# Patient Record
Sex: Female | Born: 1995 | Race: White | Hispanic: No | Marital: Single | Smoking: Never smoker
Health system: Southern US, Community
[De-identification: ages and names within clinical notes are randomized; demographics above are authoritative.]

---
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
## 2017-12-31 IMAGING — MR MR HEAD WO/W CM
13 of 18 series · 33 of 48 positions shown · IV contrast (multihance)
Comparison: None.

CLINICAL DATA: Eye swelling noted on routine eye examination today.
Evaluate optic disc edema.

EXAM:
MRI HEAD AND ORBITS WITHOUT AND WITH CONTRAST
TECHNIQUE: Multiplanar, multiecho pulse sequences of the brain and surrounding
structures were obtained without and with intravenous contrast.
Multiplanar, multiecho pulse sequences of the orbits and surrounding
structures were obtained including fat saturation techniques, before
and after intravenous contrast administration.
CONTRAST:  15mL MULTIHANCE GADOBENATE DIMEGLUMINE 529 MG/ML IV SOLN

[Series 2: T1 · sagittal · 5.0mm · 0.45mm/px · 3 of 25 slices shown (1 of 3)]
[im 1/25]
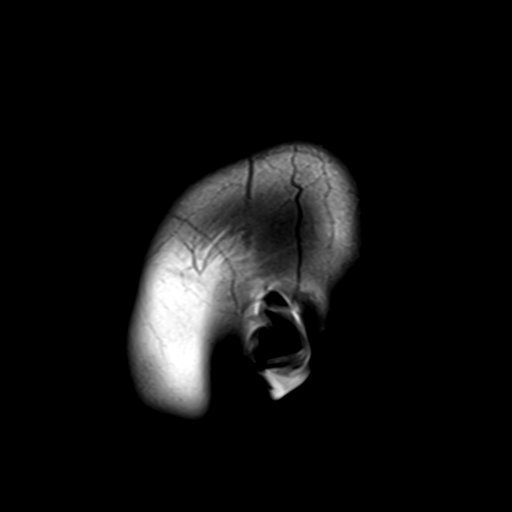
[im 13/25]
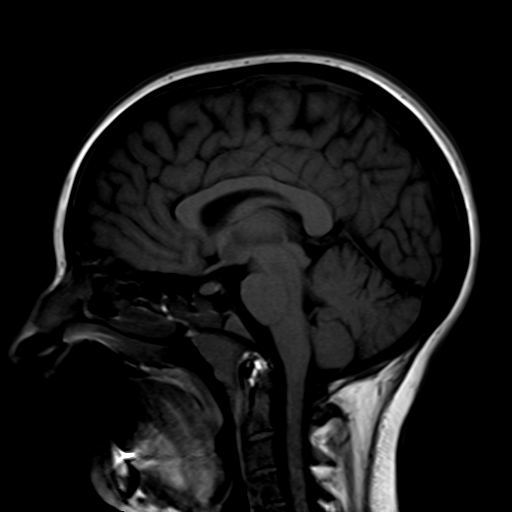
[im 25/25]
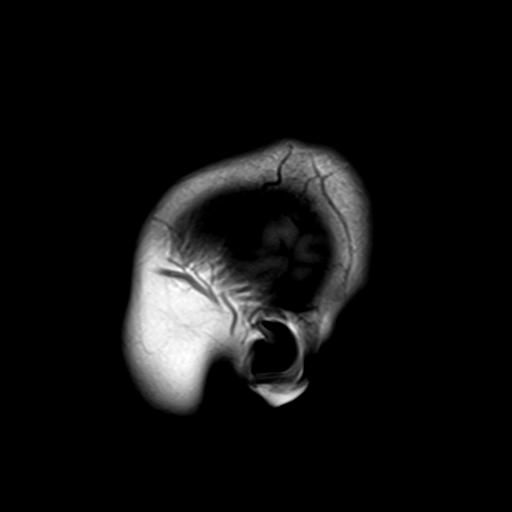

[Series 4: DWI · axial · 3.0mm · 1.80mm/px · z∈[-38,+124]mm · 5 of 55 slices shown (1 of 2)]
[im 1/55]
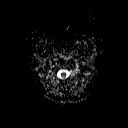
[im 14/55]
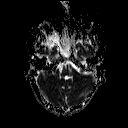
[im 28/55]
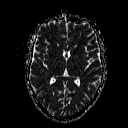
[im 41/55]
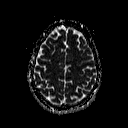
[im 55/55]
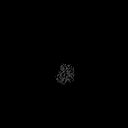

[Series 6: DWI · coronal · 3.0mm · 1.80mm/px · 5 of 45 slices shown (2 of 2)]
[im 1/45]
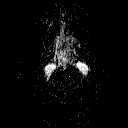
[im 12/45]
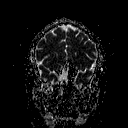
[im 23/45]
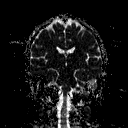
[im 34/45]
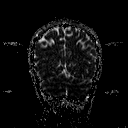
[im 45/45]
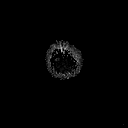

[Series 7: T2 · axial · 5.0mm · 0.60mm/px · z∈[-35,+121]mm · 2 of 25 slices shown (1 of 2)]
[im 1/25]
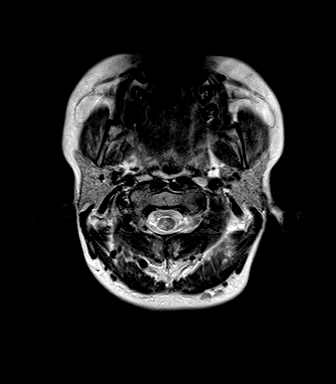
[im 25/25]
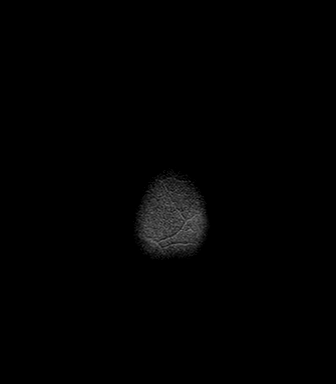

[Series 8: FLAIR · axial · 5.0mm · 0.45mm/px · z∈[-35,+121]mm · 2 of 25 slices shown]
[im 1/25]
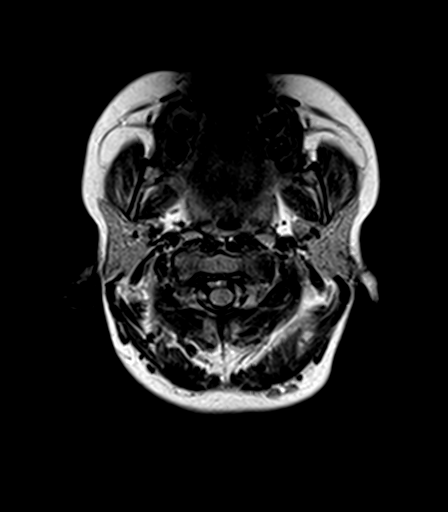
[im 25/25]
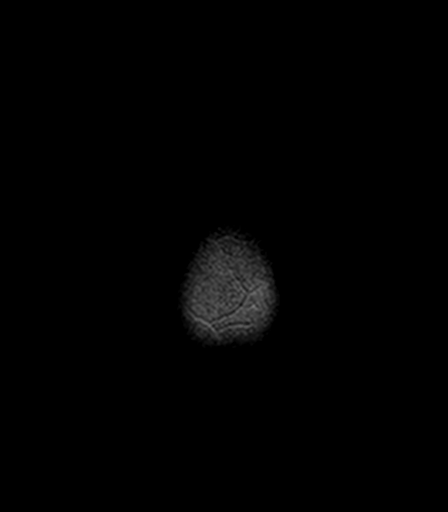

[Series 9: T2 · axial · 5.0mm · 0.45mm/px · z∈[-35,+121]mm · 2 of 25 slices shown (2 of 2)]
[im 1/25]
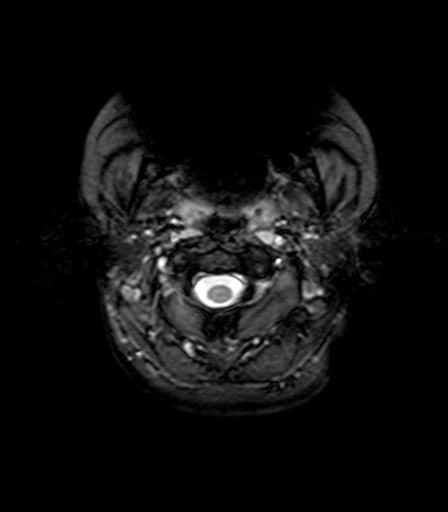
[im 25/25]
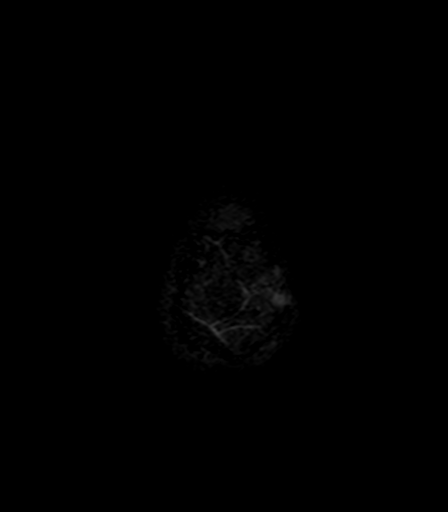

[Series 10: T2 fat-sat · axial · 3.0mm · 0.35mm/px · 1 of 17 slices shown (1 of 2)]
[im 1/17]
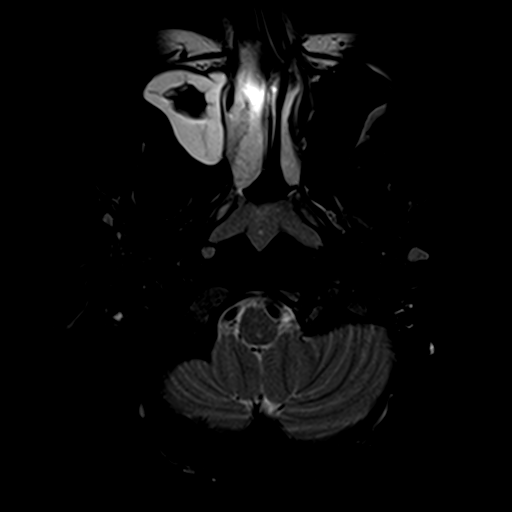

[Series 11: T2 fat-sat · coronal · 3.0mm · 0.35mm/px · 2 of 27 slices shown (2 of 2)]
[im 1/27]
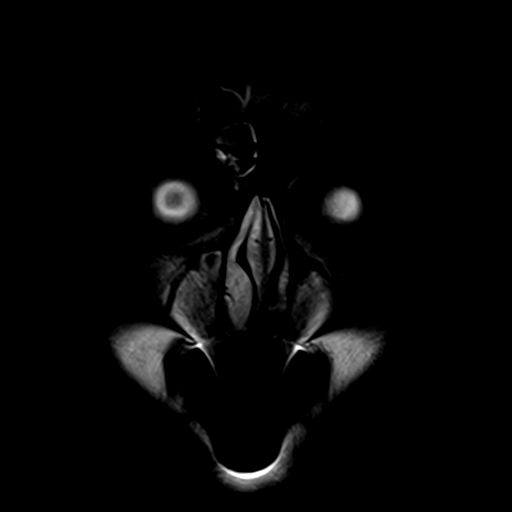
[im 27/27]
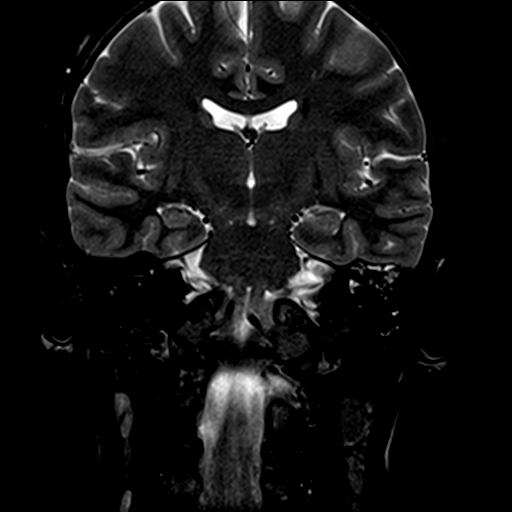

[Series 12: T1 · axial · 3.0mm · 0.39mm/px · 1 of 17 slices shown (2 of 3)]
[im 1/17]
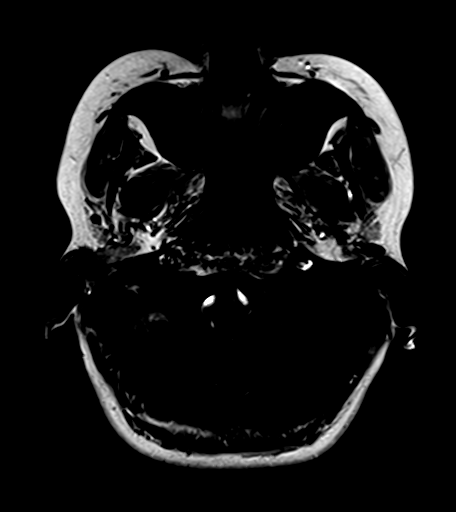

[Series 13: T1 · coronal · 3.0mm · 0.39mm/px · 2 of 27 slices shown (3 of 3)]
[im 1/27]
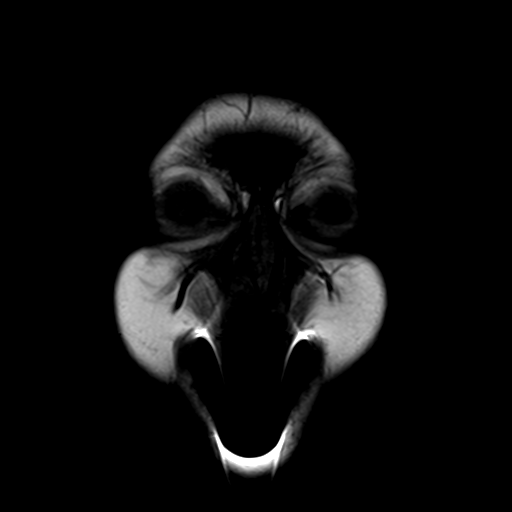
[im 27/27]
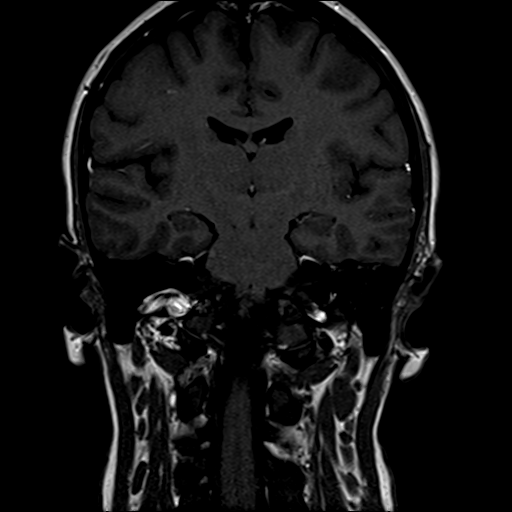

[Series 15: T2 post-contrast · coronal · 5.0mm · 0.49mm/px · 2 of 27 slices shown]
[im 1/27]
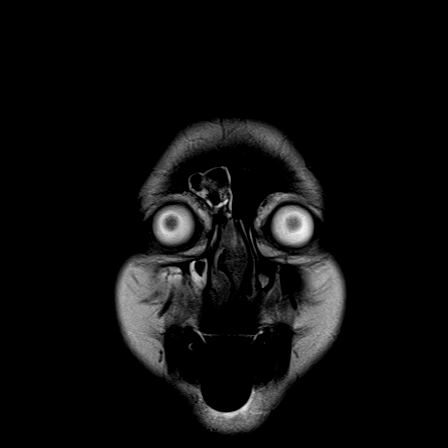
[im 27/27]
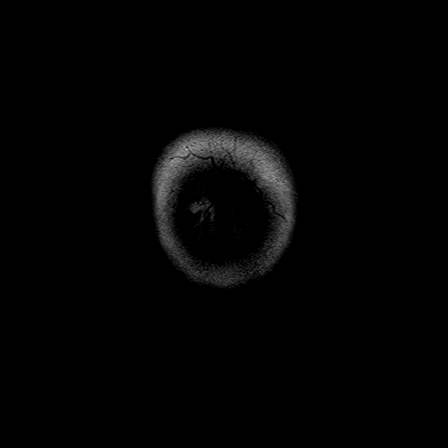

[Series 18: T1 post-contrast · axial · 3.0mm · 1.00mm/px · z∈[-38,+126]mm · 4 of 56 slices shown (1 of 2)]
[im 1/56]
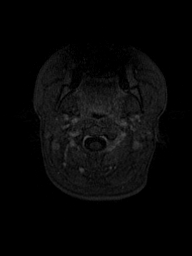
[im 19/56]
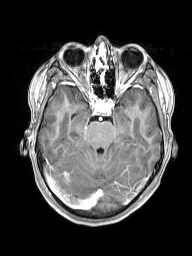
[im 37/56]
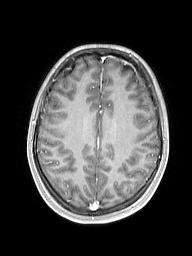
[im 56/56]
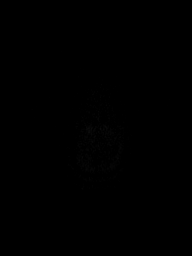

[Series 19: T1 post-contrast · coronal · 5.0mm · 0.43mm/px · 2 of 27 slices shown (2 of 2)]
[im 1/27]
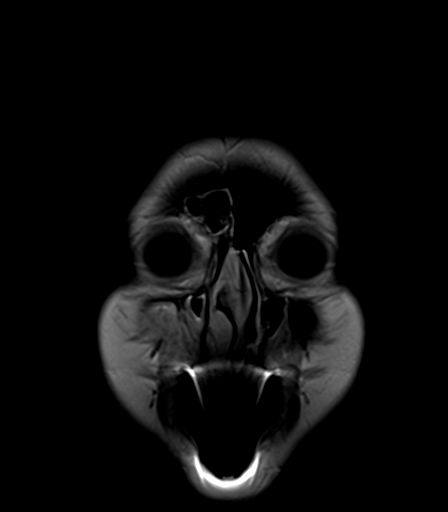
[im 27/27]
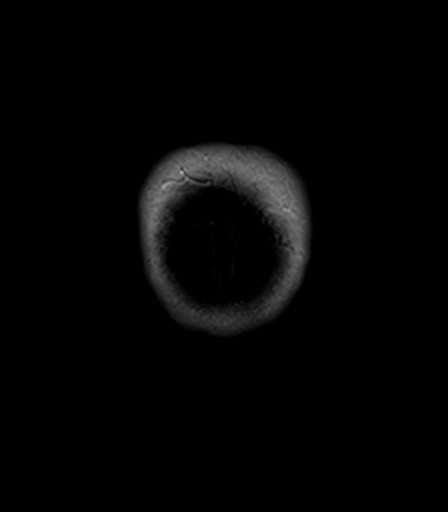

[33 of 48 positions shown; findings below may reference images not displayed]

FINDINGS: MRI HEAD FINDINGS

INTRACRANIAL CONTENTS: No reduced diffusion to suggest acute
ischemia or hyperacute demyelination. No susceptibility artifact to
suggest hemorrhage. The ventricles and sulci are normal for
patient's age. 6 mm ovoid T2 hyperintensity periphery of RIGHT
mesial cerebellum. No masses, mass effect. No abnormal
intraparenchymal or extra-axial enhancement. No abnormal extra-axial
fluid collections. No extra-axial masses. Dural venous sinuses are
patent with normal enhancement.

VASCULAR: Normal major intracranial vascular flow voids present at
skull base.

SKULL AND UPPER CERVICAL SPINE: No abnormal sellar expansion ;
normal appearance of the pituitary gland. No suspicious calvarial
bone marrow signal. Craniocervical junction maintained. Small
bilateral lateral pharyngeal lymph nodes.

OTHER: None.

MRI ORBITS FINDINGS

ORBITS: Ocular globes are intact with normal signal. Slight
symmetric flattening of the optic discs. Lenses are located.
Preservation of the orbital fat. Enlarged bilateral optic nerve
sheaths and cerebral spinal fluid space with tortuosity. Tortuous
optic nerves with normal signal, no enhancement. Normal symmetric
appearance of the extraocular muscles. No intra-ocular mass, signal
abnormality nor abnormal enhancement. Superior ophthalmic veins are
not enlarged.

VISUALIZED SINUSES: RIGHT maxillary and sphenoid sinus mucosal
thickening with air-fluid level. Mucosal thickening RIGHT frontal
and ethmoid air cells, RIGHT concha bullosa. LEFT greater than RIGHT
mastoid effusions.

SOFT TISSUES: Normal.
IMPRESSION: MRI HEAD: Single sub cm RIGHT cerebellar white matter lesion,
nonspecific though can be seen with chronic demyelination.

Moderate RIGHT paranasal sinusitis.

MR ORBITS: Dilated optic nerve sheaths and flattened optic disc
associated with intracranial hypertension without corroborative
findings.
